# Patient Record
Sex: Male | Born: 1960 | Race: White | Hispanic: No | Smoking: Never smoker
Health system: Southern US, Community
[De-identification: ages and names within clinical notes are randomized; demographics above are authoritative.]

## PROBLEM LIST (undated history)

## (undated) DIAGNOSIS — R531 Weakness: Secondary | ICD-10-CM

## (undated) DIAGNOSIS — Z8489 Family history of other specified conditions: Secondary | ICD-10-CM

## (undated) DIAGNOSIS — M519 Unspecified thoracic, thoracolumbar and lumbosacral intervertebral disc disorder: Secondary | ICD-10-CM

## (undated) HISTORY — PX: OTHER SURGICAL HISTORY: SHX169

---

## 2009-09-25 ENCOUNTER — Encounter: Admission: RE | Admit: 2009-09-25 | Discharge: 2009-09-25 | Payer: Self-pay | Admitting: Occupational Medicine

## 2013-04-18 ENCOUNTER — Other Ambulatory Visit: Payer: Self-pay | Admitting: Orthopaedic Surgery

## 2013-04-18 DIAGNOSIS — M542 Cervicalgia: Secondary | ICD-10-CM

## 2013-04-22 ENCOUNTER — Ambulatory Visit
Admission: RE | Admit: 2013-04-22 | Discharge: 2013-04-22 | Disposition: A | Discharge status: Home / Self Care | Payer: 59 | Source: Ambulatory Visit | Attending: Orthopaedic Surgery | Admitting: Orthopaedic Surgery

## 2013-04-22 DIAGNOSIS — M542 Cervicalgia: Secondary | ICD-10-CM

## 2013-04-29 ENCOUNTER — Other Ambulatory Visit (HOSPITAL_COMMUNITY): Payer: Self-pay | Admitting: Orthopaedic Surgery

## 2013-05-01 ENCOUNTER — Other Ambulatory Visit (HOSPITAL_COMMUNITY): Payer: Self-pay | Admitting: *Deleted

## 2013-05-01 NOTE — Pre-Procedure Instructions (Addendum)
Dale Melton  05/01/2013   Your procedure is scheduled on:  05/10/13 @ 7:30 AM  Report to [facility] short stay admitting at 530 AM.  Call this number if you have problems the morning of surgery: [phone]   Remember:   Do not eat food or drink liquids after midnight.   Take these medicines the morning of surgery with A SIP OF WATER:    Do not wear jewelry, make-up or nail polish.  Do not wear lotions, powders, or perfumes. You may wear deodorant.  Do not shave 48 hours prior to surgery. Men may shave face and neck.  Do not bring valuables to the hospital.  [facility] is not responsible                  for any belongings or valuables.               Contacts, dentures or bridgework may not be worn into surgery.  Leave suitcase in the car. After surgery it may be brought to your room.  For patients admitted to the hospital, discharge time is determined by your                treatment team.               Patients discharged the day of surgery will not be allowed to drive  home.  Name and phone number of your driver:   Special Instructions: Shower using CHG 2 nights before surgery and the night before surgery.  If you shower the day of surgery use CHG.  Use special wash - you have one bottle of CHG for all showers.  You should use approximately 1/3 of the bottle for each shower.   Please read over the following fact sheets that you were given: Pain Booklet, Coughing and Deep Breathing, Blood Transfusion Information, MRSA Information and Surgical Site Infection Prevention

## 2013-05-03 ENCOUNTER — Encounter (HOSPITAL_COMMUNITY): Payer: Self-pay

## 2013-05-03 ENCOUNTER — Encounter (HOSPITAL_COMMUNITY)
Admission: RE | Admit: 2013-05-03 | Discharge: 2013-05-03 | Disposition: A | Discharge status: Home / Self Care | Payer: 59 | Source: Ambulatory Visit | Attending: Orthopaedic Surgery | Admitting: Orthopaedic Surgery

## 2013-05-03 ENCOUNTER — Encounter (HOSPITAL_COMMUNITY): Payer: Self-pay | Admitting: Pharmacy Technician

## 2013-05-03 DIAGNOSIS — Z01818 Encounter for other preprocedural examination: Secondary | ICD-10-CM | POA: Insufficient documentation

## 2013-05-03 DIAGNOSIS — Z01810 Encounter for preprocedural cardiovascular examination: Secondary | ICD-10-CM | POA: Insufficient documentation

## 2013-05-03 DIAGNOSIS — Z01812 Encounter for preprocedural laboratory examination: Secondary | ICD-10-CM | POA: Insufficient documentation

## 2013-05-03 DIAGNOSIS — Z01811 Encounter for preprocedural respiratory examination: Secondary | ICD-10-CM | POA: Insufficient documentation

## 2013-05-03 HISTORY — DX: Family history of other specified conditions: Z84.89

## 2013-05-03 HISTORY — DX: Weakness: R53.1

## 2013-05-03 LAB — URINALYSIS, ROUTINE W REFLEX MICROSCOPIC
Hgb urine dipstick: NEGATIVE
Ketones, ur: NEGATIVE mg/dL
Leukocytes, UA: NEGATIVE
Nitrite: NEGATIVE
Protein, ur: NEGATIVE mg/dL
Specific Gravity, Urine: >1.03 — ABNORMAL HIGH (ref 1.005–1.030)
Urobilinogen, UA: 0.2 mg/dL (ref 0.0–1.0)
pH: 5.5 (ref 5.0–8.0)

## 2013-05-03 LAB — CBC
HCT: 43.8 % (ref 39.0–52.0)
Hemoglobin: 15.3 g/dL (ref 13.0–17.0)
MCHC: 34.9 g/dL (ref 30.0–36.0)
MCV: 91.6 fL (ref 78.0–100.0)
Platelets: 267 10*3/uL (ref 150–400)
RDW: 12.4 % (ref 11.5–15.5)
WBC: 4.9 10*3/uL (ref 4.0–10.5)

## 2013-05-03 LAB — COMPREHENSIVE METABOLIC PANEL
AST: 17 U/L (ref 0–37)
Albumin: 4 g/dL (ref 3.5–5.2)
Alkaline Phosphatase: 41 U/L (ref 39–117)
Calcium: 8.9 mg/dL (ref 8.4–10.5)
Chloride: 100 mEq/L (ref 96–112)
Creatinine, Ser: 1.07 mg/dL (ref 0.50–1.35)
GFR calc Af Amer: >90 mL/min (ref >90)
Total Protein: 7.1 g/dL (ref 6.0–8.3)

## 2013-05-03 LAB — TYPE AND SCREEN: Antibody Screen: NEGATIVE

## 2013-05-03 LAB — PROTIME-INR
INR: 1.14 (ref 0.00–1.49)
Prothrombin Time: 14.4 seconds (ref 11.6–15.2)

## 2013-05-03 LAB — SURGICAL PCR SCREEN: MRSA, PCR: NEGATIVE

## 2013-05-03 MED ORDER — CHLORHEXIDINE GLUCONATE 4 % EX LIQD: 60.0000 mL | Freq: Once | CUTANEOUS | Status: DC

## 2013-05-03 NOTE — Progress Notes (Addendum)
Pt doesn't have a cardiologist  Stress test done about 3+yrs ago as part of exit physical for retiring from [employer]  Denies ever having an echo or heart cath  Denies EKG or CXR in past year  Pt doesn't have a Medical Md

## 2013-05-08 NOTE — H&P (Signed)
Dale Melton is an 52 y.o. male.   Chief Complaint: neck pain and left arm pain HPI:   Dale Melton complains of left shoulder blade pain for the last 2-3 months.  He says that the pain radiates from the shoulder blade into the shoulder, sometimes down the ulnar side of his left arm.  He endorses numbness and tingling and some weakness.  He says that the pain directly tender right at the medial border of the scapula near an area where he had a skin cancer removed.  He says that the pain is a shooting, stabbing pain that is constant in the arm.  He is right hand dominant.  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  He does have night pain.  It keeps him awake at night.  He is not able to lay on his left side.  In terms of his elbow pain he says that this is a constant dull pain that has recently gotten worse.  He has weakness of his grip and some numbness on the ulnar side of his left hand. No relief with analgesics or NSAIDS or activity modification.   MRI of cervical spine with right C7-T1 HNP with foraminal stenosis.  EMG and NCV with severe acute C8 radiculopathy left. Recommended that pt have ACDF C7-T1. Past Medical History  Diagnosis Date  . Family history of anesthesia complication     pts dad gets sick after anesthesia  . Weakness     numbness/tingling in left arm    Past Surgical History  Procedure Laterality Date  . Cataract surgery Bilateral     No family history on file. Social History:  reports that he has never smoked. He does not have any smokeless tobacco history on file. He reports that he drinks alcohol. He reports that he does not use illicit drugs.  Allergies: No Known Allergies  No prescriptions prior to admission    No results found for this or any previous visit (from the past 48 hour(s)). No results found.  Review of Systems  Musculoskeletal: Positive for back pain.  Neurological: Positive for sensory change and focal weakness.       Left  UE  All other systems reviewed and are negative.    There were no vitals taken for this visit. Physical Exam  Constitutional: He is oriented to person, place, and time. He appears well-developed and well-nourished.  HENT:  Head: Normocephalic and atraumatic.  Eyes: Pupils are equal, round, and reactive to light.  Cardiovascular: Normal rate.   Respiratory: Effort normal.  GI: Soft.  Musculoskeletal:    Exam of the left scapula shows a post-surgical scar, but this is not tender.  The area that is tender is lateral to it along the media border of the scapula.  Scapular movement is normal.  Left upper extremity appearance is normal.  Exam of the left elbow shows pain in the cubital tunnel.  He has a negative Tinel sign, negative compression sign.  He does have decreased sensation in the ulnar nerve distribution of the left hand.  He has slighter weaker grip of the left hand and he is slightly weaker at the first dorsal interosseous muscle.  Hand is warm and well perfused.    Neurological: He is alert and oriented to person, place, and time.  Skin: Skin is warm and dry.  Psychiatric: He has a normal mood and affect.     Assessment/Plan HNP C7-T1  PLAN:  ACDF C7-T1  Dale Melton  M 05/08/2013, 4:09 PM

## 2013-05-09 MED ORDER — CEFAZOLIN SODIUM-DEXTROSE 2-3 GM-% IV SOLR
2.0000 g | INTRAVENOUS | Status: AC
  Administered 2013-05-10: 2 g via INTRAVENOUS
  Filled 2013-05-09: qty 50

## 2013-05-10 ENCOUNTER — Encounter (HOSPITAL_COMMUNITY): Payer: 59 | Admitting: Anesthesiology

## 2013-05-10 ENCOUNTER — Ambulatory Visit (HOSPITAL_COMMUNITY): Payer: 59

## 2013-05-10 ENCOUNTER — Encounter (HOSPITAL_COMMUNITY): Payer: Self-pay | Admitting: Surgery

## 2013-05-10 ENCOUNTER — Encounter (HOSPITAL_COMMUNITY)
Admission: RE | Disposition: A | Discharge status: Home / Self Care | Payer: Self-pay | Source: Ambulatory Visit | Attending: Orthopaedic Surgery

## 2013-05-10 ENCOUNTER — Inpatient Hospital Stay (HOSPITAL_COMMUNITY)
Admission: RE | Admit: 2013-05-10 | Discharge: 2013-05-11 | DRG: 473 | Disposition: A | Discharge status: Home / Self Care | Payer: 59 | Source: Ambulatory Visit | Attending: Orthopaedic Surgery | Admitting: Orthopaedic Surgery

## 2013-05-10 ENCOUNTER — Ambulatory Visit (HOSPITAL_COMMUNITY): Payer: 59 | Admitting: Anesthesiology

## 2013-05-10 DIAGNOSIS — R071 Chest pain on breathing: Secondary | ICD-10-CM | POA: Diagnosis not present

## 2013-05-10 DIAGNOSIS — M5020 Other cervical disc displacement, unspecified cervical region: Principal | ICD-10-CM | POA: Diagnosis present

## 2013-05-10 HISTORY — PX: ANTERIOR CERVICAL DECOMP/DISCECTOMY FUSION: SHX1161

## 2013-05-10 HISTORY — DX: Unspecified thoracic, thoracolumbar and lumbosacral intervertebral disc disorder: M51.9

## 2013-05-10 SURGERY — ANTERIOR CERVICAL DECOMPRESSION/DISCECTOMY FUSION 2 LEVELS
Anesthesia: General | Site: Neck

## 2013-05-10 MED ORDER — NEOSTIGMINE METHYLSULFATE 1 MG/ML IJ SOLN
INTRAMUSCULAR | Status: DC | PRN
  Administered 2013-05-10: 3 mg via INTRAVENOUS

## 2013-05-10 MED ORDER — METHOCARBAMOL 500 MG PO TABS: 500.0000 mg | ORAL_TABLET | Freq: Four times a day (QID) | ORAL | Status: DC | PRN

## 2013-05-10 MED ORDER — LACTATED RINGERS IV SOLN
INTRAVENOUS | Status: DC | PRN
  Administered 2013-05-10 (×2): via INTRAVENOUS

## 2013-05-10 MED ORDER — METHOCARBAMOL 100 MG/ML IJ SOLN
500.0000 mg | Freq: Four times a day (QID) | INTRAMUSCULAR | Status: DC | PRN
  Filled 2013-05-10 (×2): qty 5

## 2013-05-10 MED ORDER — MORPHINE SULFATE 2 MG/ML IJ SOLN
1.0000 mg | INTRAMUSCULAR | Status: DC | PRN
  Administered 2013-05-10: 2 mg via INTRAVENOUS

## 2013-05-10 MED ORDER — SODIUM CHLORIDE 0.9 % IJ SOLN: 3.0000 mL | INTRAMUSCULAR | Status: DC | PRN

## 2013-05-10 MED ORDER — SENNOSIDES-DOCUSATE SODIUM 8.6-50 MG PO TABS: 1.0000 | ORAL_TABLET | Freq: Every evening | ORAL | Status: DC | PRN

## 2013-05-10 MED ORDER — PROMETHAZINE HCL 25 MG/ML IJ SOLN
12.5000 mg | Freq: Four times a day (QID) | INTRAMUSCULAR | Status: DC | PRN
  Administered 2013-05-10: 12.5 mg via INTRAVENOUS
  Filled 2013-05-10 (×2): qty 1

## 2013-05-10 MED ORDER — MORPHINE SULFATE 2 MG/ML IJ SOLN
INTRAMUSCULAR | Status: AC
Start: 2013-05-10 — End: 2013-05-10
  Filled 2013-05-10: qty 1

## 2013-05-10 MED ORDER — KCL IN DEXTROSE-NACL 20-5-0.45 MEQ/L-%-% IV SOLN
INTRAVENOUS | Status: DC
  Administered 2013-05-10 – 2013-05-11 (×2): via INTRAVENOUS
  Filled 2013-05-10 (×3): qty 1000

## 2013-05-10 MED ORDER — ZOLPIDEM TARTRATE 5 MG PO TABS: 5.0000 mg | ORAL_TABLET | Freq: Every evening | ORAL | Status: DC | PRN

## 2013-05-10 MED ORDER — DOCUSATE SODIUM 100 MG PO CAPS
100.0000 mg | ORAL_CAPSULE | Freq: Two times a day (BID) | ORAL | Status: DC
Start: 2013-05-10 — End: 2013-05-11
  Filled 2013-05-10 (×3): qty 1

## 2013-05-10 MED ORDER — MENTHOL 3 MG MT LOZG: 1.0000 | LOZENGE | OROMUCOSAL | Status: DC | PRN

## 2013-05-10 MED ORDER — EPHEDRINE SULFATE 50 MG/ML IJ SOLN
INTRAMUSCULAR | Status: DC | PRN
  Administered 2013-05-10: 15 mg via INTRAVENOUS

## 2013-05-10 MED ORDER — INFLUENZA VAC SPLIT QUAD 0.5 ML IM SUSP
0.5000 mL | INTRAMUSCULAR | Status: DC
  Filled 2013-05-10: qty 0.5

## 2013-05-10 MED ORDER — HYDROMORPHONE HCL PF 1 MG/ML IJ SOLN: 0.2500 mg | INTRAMUSCULAR | Status: DC | PRN

## 2013-05-10 MED ORDER — KETOROLAC TROMETHAMINE 30 MG/ML IJ SOLN
INTRAMUSCULAR | Status: AC
Start: 2013-05-10 — End: 2013-05-10
  Filled 2013-05-10: qty 1

## 2013-05-10 MED ORDER — PHENOL 1.4 % MT LIQD
1.0000 | OROMUCOSAL | Status: DC | PRN
  Filled 2013-05-10: qty 177

## 2013-05-10 MED ORDER — FENTANYL CITRATE 0.05 MG/ML IJ SOLN
INTRAMUSCULAR | Status: DC | PRN
  Administered 2013-05-10: 150 ug via INTRAVENOUS

## 2013-05-10 MED ORDER — METHOCARBAMOL 500 MG PO TABS: 500.0000 mg | ORAL_TABLET | Freq: Four times a day (QID) | ORAL | Status: AC | PRN

## 2013-05-10 MED ORDER — KETOROLAC TROMETHAMINE 30 MG/ML IJ SOLN
30.0000 mg | Freq: Once | INTRAMUSCULAR | Status: AC
  Administered 2013-05-10: 30 mg via INTRAVENOUS

## 2013-05-10 MED ORDER — ONDANSETRON HCL 4 MG/2ML IJ SOLN
4.0000 mg | INTRAMUSCULAR | Status: DC | PRN
  Administered 2013-05-10: 4 mg via INTRAVENOUS
  Filled 2013-05-10: qty 2

## 2013-05-10 MED ORDER — PHENYLEPHRINE HCL 10 MG/ML IJ SOLN
10.0000 mg | INTRAVENOUS | Status: DC | PRN
  Administered 2013-05-10: 40 ug/min via INTRAVENOUS

## 2013-05-10 MED ORDER — LIDOCAINE HCL 4 % MT SOLN
OROMUCOSAL | Status: DC | PRN
  Administered 2013-05-10: 4 mL via TOPICAL

## 2013-05-10 MED ORDER — ACETAMINOPHEN 650 MG RE SUPP: 650.0000 mg | RECTAL | Status: DC | PRN

## 2013-05-10 MED ORDER — SODIUM CHLORIDE 0.9 % IJ SOLN
3.0000 mL | Freq: Two times a day (BID) | INTRAMUSCULAR | Status: DC
Start: 2013-05-10 — End: 2013-05-11
  Administered 2013-05-10: 3 mL via INTRAVENOUS

## 2013-05-10 MED ORDER — BISACODYL 10 MG RE SUPP: 10.0000 mg | Freq: Every day | RECTAL | Status: DC | PRN

## 2013-05-10 MED ORDER — BUPIVACAINE HCL 0.25 % IJ SOLN
INTRAMUSCULAR | Status: DC | PRN
  Administered 2013-05-10: 7 mL

## 2013-05-10 MED ORDER — ACETAMINOPHEN 325 MG PO TABS: 650.0000 mg | ORAL_TABLET | ORAL | Status: DC | PRN

## 2013-05-10 MED ORDER — ONDANSETRON HCL 4 MG/2ML IJ SOLN
INTRAMUSCULAR | Status: DC | PRN
  Administered 2013-05-10: 4 mg via INTRAVENOUS

## 2013-05-10 MED ORDER — PANTOPRAZOLE SODIUM 40 MG IV SOLR
40.0000 mg | Freq: Every day | INTRAVENOUS | Status: DC
  Administered 2013-05-10: 40 mg via INTRAVENOUS
  Filled 2013-05-10 (×2): qty 40

## 2013-05-10 MED ORDER — SODIUM CHLORIDE 0.9 % IV SOLN: 250.0000 mL | INTRAVENOUS | Status: DC

## 2013-05-10 MED ORDER — CEFAZOLIN SODIUM 1-5 GM-% IV SOLN
1.0000 g | Freq: Once | INTRAVENOUS | Status: AC
  Administered 2013-05-10: 1 g via INTRAVENOUS
  Filled 2013-05-10: qty 50

## 2013-05-10 MED ORDER — MIDAZOLAM HCL 5 MG/5ML IJ SOLN
INTRAMUSCULAR | Status: DC | PRN
  Administered 2013-05-10: 2 mg via INTRAVENOUS

## 2013-05-10 MED ORDER — OXYCODONE-ACETAMINOPHEN 5-325 MG PO TABS: 1.0000 | ORAL_TABLET | ORAL | Status: DC | PRN

## 2013-05-10 MED ORDER — ROCURONIUM BROMIDE 100 MG/10ML IV SOLN
INTRAVENOUS | Status: DC | PRN
  Administered 2013-05-10: 50 mg via INTRAVENOUS

## 2013-05-10 MED ORDER — HYDROCODONE-ACETAMINOPHEN 5-325 MG PO TABS: 1.0000 | ORAL_TABLET | ORAL | Status: DC | PRN

## 2013-05-10 MED ORDER — LIDOCAINE HCL (CARDIAC) 20 MG/ML IV SOLN
INTRAVENOUS | Status: DC | PRN
  Administered 2013-05-10: 80 mg via INTRAVENOUS

## 2013-05-10 MED ORDER — PROPOFOL 10 MG/ML IV BOLUS
INTRAVENOUS | Status: DC | PRN
  Administered 2013-05-10: 200 mg via INTRAVENOUS

## 2013-05-10 MED ORDER — FLEET ENEMA 7-19 GM/118ML RE ENEM: 1.0000 | ENEMA | Freq: Once | RECTAL | Status: AC | PRN

## 2013-05-10 MED ORDER — GLYCOPYRROLATE 0.2 MG/ML IJ SOLN
INTRAMUSCULAR | Status: DC | PRN
  Administered 2013-05-10: 0.6 mg via INTRAVENOUS

## 2013-05-10 MED ORDER — BUPIVACAINE HCL (PF) 0.25 % IJ SOLN
INTRAMUSCULAR | Status: AC
  Filled 2013-05-10: qty 30

## 2013-05-10 MED ORDER — OXYCODONE-ACETAMINOPHEN 5-325 MG PO TABS: 1.0000 | ORAL_TABLET | ORAL | Status: AC | PRN

## 2013-05-10 MED ORDER — ONDANSETRON HCL 4 MG/2ML IJ SOLN: 4.0000 mg | Freq: Once | INTRAMUSCULAR | Status: DC | PRN

## 2013-05-10 SURGICAL SUPPLY — 58 items
ADH SKN CLS APL DERMABOND .7 (GAUZE/BANDAGES/DRESSINGS) ×1
APL SKNCLS STERI-STRIP NONHPOA (GAUZE/BANDAGES/DRESSINGS) ×1
BENZOIN TINCTURE PRP APPL 2/3 (GAUZE/BANDAGES/DRESSINGS) ×1 IMPLANT
BIT DRILL SKYLINE 14 (BIT) ×1
BIT DRILL SKYLINE 14MM (BIT) IMPLANT
BLADE SURG ROTATE 9660 (MISCELLANEOUS) ×1 IMPLANT
BONE CERV LORDOTIC 14.5X12X6 (Bone Implant) ×2 IMPLANT
BUR ROUND FLUTED 4 SOFT TCH (BURR) IMPLANT
CLOTH BEACON ORANGE TIMEOUT ST (SAFETY) ×2 IMPLANT
CLSR STERI-STRIP ANTIMIC 1/2X4 (GAUZE/BANDAGES/DRESSINGS) ×1 IMPLANT
COLLAR CERV LO CONTOUR FIRM DE (SOFTGOODS) ×2 IMPLANT
CORDS BIPOLAR (ELECTRODE) ×1 IMPLANT
COVER SURGICAL LIGHT HANDLE (MISCELLANEOUS) ×2 IMPLANT
DERMABOND ADVANCED (GAUZE/BANDAGES/DRESSINGS) ×1
DERMABOND ADVANCED .7 DNX12 (GAUZE/BANDAGES/DRESSINGS) ×1 IMPLANT
DRAPE C-ARM 42X72 X-RAY (DRAPES) ×2 IMPLANT
DRAPE MICROSCOPE LEICA (MISCELLANEOUS) ×2 IMPLANT
DRAPE PROXIMA HALF (DRAPES) ×2 IMPLANT
DRILL BIT SKYLINE 14MM (BIT) ×2
DRSG MEPILEX BORDER 4X4 (GAUZE/BANDAGES/DRESSINGS) ×2 IMPLANT
DRSG MEPILEX BORDER 4X8 (GAUZE/BANDAGES/DRESSINGS) ×2 IMPLANT
DURAPREP 6ML APPLICATOR 50/CS (WOUND CARE) ×2 IMPLANT
ELECT COATED BLADE 2.86 ST (ELECTRODE) ×2 IMPLANT
ELECT REM PT RETURN 9FT ADLT (ELECTROSURGICAL) ×2
ELECTRODE REM PT RTRN 9FT ADLT (ELECTROSURGICAL) ×1 IMPLANT
EVACUATOR 1/8 PVC DRAIN (DRAIN) ×2 IMPLANT
GAUZE XEROFORM 1X8 LF (GAUZE/BANDAGES/DRESSINGS) ×2 IMPLANT
GLOVE BIOGEL PI IND STRL 7.5 (GLOVE) ×1 IMPLANT
GLOVE BIOGEL PI IND STRL 8 (GLOVE) ×1 IMPLANT
GLOVE BIOGEL PI INDICATOR 7.5 (GLOVE) ×1
GLOVE BIOGEL PI INDICATOR 8 (GLOVE) ×1
GLOVE ECLIPSE 7.0 STRL STRAW (GLOVE) ×2 IMPLANT
GLOVE ORTHO TXT STRL SZ7.5 (GLOVE) ×2 IMPLANT
GOWN PREVENTION PLUS LG XLONG (DISPOSABLE) ×1 IMPLANT
GOWN PREVENTION PLUS XLARGE (GOWN DISPOSABLE) ×2 IMPLANT
GOWN STRL NON-REIN LRG LVL3 (GOWN DISPOSABLE) ×4 IMPLANT
GRAFT BNE SPCR VG2 14.5X12X6 (Bone Implant) IMPLANT
HEAD HALTER (SOFTGOODS) ×2 IMPLANT
HEMOSTAT SURGICEL 2X14 (HEMOSTASIS) IMPLANT
KIT BASIN OR (CUSTOM PROCEDURE TRAY) ×2 IMPLANT
KIT ROOM TURNOVER OR (KITS) ×2 IMPLANT
MANIFOLD NEPTUNE II (INSTRUMENTS) ×2 IMPLANT
NDL 25GX 5/8IN NON SAFETY (NEEDLE) ×1 IMPLANT
NEEDLE 25GX 5/8IN NON SAFETY (NEEDLE) ×2 IMPLANT
NS IRRIG 1000ML POUR BTL (IV SOLUTION) ×2 IMPLANT
PACK ORTHO CERVICAL (CUSTOM PROCEDURE TRAY) ×2 IMPLANT
PAD ARMBOARD 7.5X6 YLW CONV (MISCELLANEOUS) ×4 IMPLANT
PATTIES SURGICAL .5 X.5 (GAUZE/BANDAGES/DRESSINGS) ×1 IMPLANT
PLATE ONE LEVEL SKYLINE 14MM (Plate) ×1 IMPLANT
SCREW VAR SELF TAP SKYLINE 14M (Screw) ×4 IMPLANT
SPONGE GAUZE 4X4 12PLY (GAUZE/BANDAGES/DRESSINGS) ×2 IMPLANT
SURGIFLO TRUKIT (HEMOSTASIS) IMPLANT
SUT VIC AB 3-0 X1 27 (SUTURE) ×2 IMPLANT
SUT VICRYL 4-0 PS2 18IN ABS (SUTURE) ×4 IMPLANT
SYR 30ML SLIP (SYRINGE) ×2 IMPLANT
TOWEL OR 17X24 6PK STRL BLUE (TOWEL DISPOSABLE) ×2 IMPLANT
TOWEL OR 17X26 10 PK STRL BLUE (TOWEL DISPOSABLE) ×2 IMPLANT
WATER STERILE IRR 1000ML POUR (IV SOLUTION) ×2 IMPLANT

## 2013-05-10 NOTE — Progress Notes (Signed)
Patient ID: Dale Melton, male   DOB: [birth year], 53 y.o.   MRN: [id number] Pt just delivered to room and on the way to the floor complained of chest tightness.  He had not had any discomfort in the PACU.  Denies radiation of pain in the arms or neck.  States he feels tight over his anterior chest and indicates this area above nipple line and bilateral.  No pain with deep inspiration. Some discomfort with movement.  Has not had any pain medication in the PACU.  Vital signs have been stable and remained so when taken in room currently.  Exam:  Heart sounds clear and without murmur.  Regular rate and rhythm to auscultation.  Tender over anterior chest wall with palpation.  Mildly tender just below clavicle right > left.  nontender with motion of bilateral UE and shoulder motion.  hemovac draining well.  Lungs sounds clear.  Taking clear liquids now. VS temp 97.4, pulse 77 reg, resp 18, BP 124/71, 100% room air  IMP:  S/P C7-T1 ACDF.  Current anterior chest wall pain likely related to manual traction during procedure as he is most uncomfortable with palpation over chest wall.  Will check EKG to assess possible cardiac source of pain.   Will give analgesics as well.

## 2013-05-10 NOTE — Discharge Instructions (Signed)
No lifting greater than 10 lbs. No overhead use of arms. Avoid bending,and twisting neck. Walk in house for first week them may start to get out slowly increasing distance up to one mile by 3 weeks post op. Keep incision dry for 3 days, may then bathe and wet incision using a covered collar when showering. Call if any fevers >101, chills, or increasing numbness or weakness or increased swelling or drainage. Ice packs as needed for pain and swelling.  Soft diet for sore throat, advance as tolerated.  Wear soft collar at ALL times, even when sleeping.

## 2013-05-10 NOTE — Anesthesia Procedure Notes (Signed)
Procedure Name: Intubation Date/Time: 05/10/2013 7:38 AM Performed by: Carney Living Pre-anesthesia Checklist: Patient identified, Emergency Drugs available, Suction available, Patient being monitored and Timeout performed Patient Re-evaluated:Patient Re-evaluated prior to inductionOxygen Delivery Method: Circle system utilized Preoxygenation: Pre-oxygenation with 100% oxygen Intubation Type: IV induction Ventilation: Mask ventilation without difficulty Laryngoscope Size: Mac and 4 Grade View: Grade I Tube type: Oral Tube size: 7.5 mm Number of attempts: 1 Airway Equipment and Method: Stylet and LTA kit utilized Placement Confirmation: ETT inserted through vocal cords under direct vision,  positive ETCO2 and breath sounds checked- equal and bilateral Secured at: 23 cm Tube secured with: Tape Dental Injury: Teeth and Oropharynx as per pre-operative assessment

## 2013-05-10 NOTE — Plan of Care (Signed)
Problem: Consults Goal: Diagnosis - Spinal Surgery Cervical Spine Fusion: ACDF C7-T1     

## 2013-05-10 NOTE — Brief Op Note (Cosign Needed)
05/10/2013  9:15 AM  PATIENT:  Dale Melton  53 y.o. male  PRE-OPERATIVE DIAGNOSIS:  Left C7-T1 HNP  POST-OPERATIVE DIAGNOSIS:  Left C7-T1 HNP  PROCEDURE:  Procedure(s) with comments: ANTERIOR CERVICAL DECOMPRESSION/DISCECTOMY FUSION (N/A) - C7-T1 Anterior Cervical Discectomy and Fusion, Allograft, Plate  SURGEON:  Surgeon(s) and Role:    * Eldred MangesMark C Yates, MD - Primary  PHYSICIAN ASSISTANT: Maud DeedSheila Tri Chittick PAC  ASSISTANTS: none   ANESTHESIA:   general  EBL:     BLOOD ADMINISTERED:none  DRAINS: (1) Hemovact drain(s) in the anterior neck with  Suction Open   LOCAL MEDICATIONS USED:  MARCAINE     SPECIMEN:  No Specimen  DISPOSITION OF SPECIMEN:  N/A  COUNTS:  YES  TOURNIQUET:  * No tourniquets in log *  DICTATION: .Note written in EPIC  PLAN OF CARE: Admit to inpatient   PATIENT DISPOSITION:  PACU - hemodynamically stable.   Delay start of Pharmacological VTE agent (>24hrs) due to surgical blood loss or risk of bleeding: yes

## 2013-05-10 NOTE — Interval H&P Note (Signed)
History and Physical Interval Note:  05/10/2013 7:23 AM  Dale Melton  has presented today for surgery, with the diagnosis of Left C7-T1 HNP  The various methods of treatment have been discussed with the patient and family. After consideration of risks, benefits and other options for treatment, the patient has consented to  Procedure(s) with comments: ANTERIOR CERVICAL DECOMPRESSION/DISCECTOMY FUSION 2 LEVELS (N/A) - C7-T1 Anterior Cervical Discectomy and Fusion, Allograft, Plate as a surgical intervention .  The patient's history has been reviewed, patient examined, no change in status, stable for surgery.  I have reviewed the patient's chart and labs.  Questions were answered to the patient's satisfaction.     Valeda Corzine C

## 2013-05-10 NOTE — Anesthesia Preprocedure Evaluation (Addendum)
Anesthesia Evaluation  Patient identified by MRN, date of birth, ID band Patient awake    Reviewed: Allergy & Precautions, H&P , NPO status , Patient's Chart, lab work & pertinent test results  Airway Mallampati: II TM Distance: >3 FB Neck ROM: Full    Dental  (+) Teeth Intact and Dental Advisory Given   Pulmonary          Cardiovascular     Neuro/Psych    GI/Hepatic   Endo/Other    Renal/GU      Musculoskeletal   Abdominal   Peds  Hematology   Anesthesia Other Findings   Reproductive/Obstetrics                          Anesthesia Physical Anesthesia Plan  ASA: I  Anesthesia Plan: General   Post-op Pain Management:    Induction: Intravenous  Airway Management Planned: Oral ETT  Additional Equipment:   Intra-op Plan:   Post-operative Plan: Extubation in OR  Informed Consent: I have reviewed the patients History and Physical, chart, labs and discussed the procedure including the risks, benefits and alternatives for the proposed anesthesia with the patient or authorized representative who has indicated his/her understanding and acceptance.   Dental advisory given  Plan Discussed with: CRNA and Anesthesiologist  Anesthesia Plan Comments:        Anesthesia Quick Evaluation

## 2013-05-10 NOTE — Preoperative (Signed)
Beta Blockers   Reason not to administer Beta Blockers:Not Applicable 

## 2013-05-10 NOTE — Transfer of Care (Signed)
Immediate Anesthesia Transfer of Care Note  Patient: Dale Melton  Procedure(s) Performed: Procedure(s) with comments: ANTERIOR CERVICAL DECOMPRESSION/DISCECTOMY FUSION (N/A) - C7-T1 Anterior Cervical Discectomy and Fusion, Allograft, Plate  Patient Location: PACU  Anesthesia Type:General  Level of Consciousness: awake, alert , oriented and patient cooperative  Airway & Oxygen Therapy: Patient Spontanous Breathing and Patient connected to nasal cannula oxygen  Post-op Assessment: Report given to PACU RN and Patient moving all extremities X 4  Post vital signs: Reviewed and stable  Complications: No apparent anesthesia complications

## 2013-05-10 NOTE — Anesthesia Postprocedure Evaluation (Signed)
  Anesthesia Post-op Note  Patient: Dale Melton  Procedure(s) Performed: Procedure(s) with comments: ANTERIOR CERVICAL DECOMPRESSION/DISCECTOMY FUSION (N/A) - C7-T1 Anterior Cervical Discectomy and Fusion, Allograft, Plate  Patient Location: PACU  Anesthesia Type:General  Level of Consciousness: awake, alert , oriented and patient cooperative  Airway and Oxygen Therapy: Patient Spontanous Breathing  Post-op Pain: mild  Post-op Assessment: Post-op Vital signs reviewed, Patient's Cardiovascular Status Stable, Respiratory Function Stable, Patent Airway, No signs of Nausea or vomiting and Pain level controlled  Post-op Vital Signs: stable  Complications: No apparent anesthesia complications

## 2013-05-10 NOTE — Progress Notes (Signed)
Orthopedic Tech Progress Note Patient Details:  Dale Melton April 23, 1961 [id number]  Ortho Devices Type of Ortho Device: Soft collar Ortho Device/Splint Interventions: Application   Cammer, Mickie BailJennifer Carol 05/10/2013, 2:28 PM

## 2013-05-10 NOTE — Progress Notes (Signed)
Dr. Ophelia CharterYates notified of patient's c/o tightness -chest as soon as we got out of elevator towards patients room. Order for 12 lead EKG. Velna HatchetSheila PA at bedside. Examined pt. No other s/s given VS; T T 97.4, P 77, R 18 BP 124/71, 100% on room.

## 2013-05-11 NOTE — Progress Notes (Signed)
Patient discharged home accompanied by wife. Dressing changed as ordered. Discharge instructions reviewed with patient and family. Understanding stated. Will follow up as scheduled.

## 2013-05-11 NOTE — Progress Notes (Signed)
Patient ID: Dale AlexandersLarry J Colombo, male   DOB: 08-10-1960, 53 y.o.   MRN: [id number] Drain removed , good relief of pre-op arm pain , some shoulder and upper chest soreness. Pain mild. Discharge home. Collar stays on . Office one week.

## 2013-05-11 NOTE — Op Note (Signed)
Dale Melton, Dale Melton NO.:  [id number]  MEDICAL RECORD NO.:  [id number]  LOCATION:  [id number]                        FACILITY:  [facility]  PHYSICIAN:  Mark C. Ophelia Charter, M.D.    DATE OF BIRTH:  24-Oct-1960  DATE OF PROCEDURE:  05/10/2013 DATE OF DISCHARGE:                              OPERATIVE REPORT   PREOPERATIVE DIAGNOSIS:  C7-T1 herniated nucleus pulposus with radiculopathy.  POSTOPERATIVE DIAGNOSIS:  C7-T1 herniated nucleus pulposus with radiculopathy.  PROCEDURE:  C7-T1 anterior cervical diskectomy and fusion, allograft and plate.  DePuy plate, 14 mm screws, 6 mm cortical cancellous allograft.  ASSISTANT:  Maud Deed, PA-C, medically necessary and present for the entire procedure.  ESTIMATED BLOOD LOSS:  Minimal.  PROCEDURE IN DETAIL:  After induction of general anesthesia, orotracheal intubation, the patient had his arm out the side with yellow pads wrist restraints.  Cervical traction harness was applied, taped, but no weight was applied.  Neck was prepped with DuraPrep.  The area was squared with towels, sterile skin marker, Betadine, Steri-Drape application, thyroid sheets, and drapes.  Time-out procedure was completed.  Ancef was given prophylactically.  Midline incision was made starting at the midline extending to the left lobe just above the clavicle based on palpable landmarks and expected C7-T1.  The patient had a long neck and C7-T1 was accessible above the clavicle.  Blunt dissection was performed. Inferior thyroid transverse artery had to be coagulated.  Spur at C6-C7 was palpable and incision was directly over the C7-T1 level.  Spinal localization with short 25 needle straight clamp, placed in C7-T1, pulled down to wrist restraints across table, bilateral C-arm had been draped, confirming the appropriate level counting down from the skull. Spurs were at C6-C7 and the operative level was 1 level below.  Cloward teeth retractors were placed in  right and left, smooth blades up and down.  Diskectomy was performed using the operative microscope, regressing back to the posterior cortex.  There were extruded fragments present through the ligament, some were paracentral left and more cephalad back behind the C7 vertebral body.  These were teased out and removed until the dura was completely decompressed with particular attention to the left side.  Trial sizers, bone preparation end plates using the burr minimally and also using curettes and rasp.  A 1-2 mm Kerrisons were used to remove the posterior longitudinal ligament and spurs.  Trial sizers showed a 6 giving a nice restoration of the disk space height and was tight graft. C6-C7 level had anterior spurs present and with head halter traction applied, the graft was marked in the midline and graft was inserted at C7-T1 with the marked graft directly in midline countersunk 2 mm.  A Skyline plate was selected, 14 mm screws x4 were inserted, 14 mm plate, 14 mm screws.  Final spot x-rays confirmed excellent position and alignment.  Operative field was dry. Irrigation with saline solution.  Hemovac was placed in line with the skin incision in-and-out technique.  On the left side, reapproximation of platysma with 3-0 Vicryl, 4-0 Vicryl subcuticular closure.  Tincture of benzoin, Steri-Strips, Marcaine infiltration, postop dressing, and soft cervical collar.  Instrument count and needle count was correct.  The patient was transferred to recovery room.  He was neurologically intact and had good strength in the left upper extremity, complete relief of his preoperative radicular pain from the HNP and nerve root compression.     Mark C. Ophelia CharterYates, M.D.     MCY/MEDQ  D:  05/10/2013  T:  05/10/2013  Job:  [id number]

## 2013-05-14 NOTE — Discharge Summary (Signed)
Physician Discharge Summary  Patient ID: Dale Melton MRN: [id number] DOB/AGE: 53/24/1962 53 y.o.  Admit date: 05/10/2013 Discharge date: 05/11/2013  Admission Diagnoses:  HNP (herniated nucleus pulposus), cervical C7-T1  Discharge Diagnoses:  Principal Problem:   HNP (herniated nucleus pulposus), cervical C7-T1  Past Medical History  Diagnosis Date  . Family history of anesthesia complication     pts dad gets sick after anesthesia  . Weakness     numbness/tingling in left arm  . Ruptured disk     Surgeries: Procedure(s): ANTERIOR CERVICAL DECOMPRESSION/DISCECTOMY FUSION C7-T1 on 05/10/2013   Consultants (if any):  none  Discharged Condition: Improved  Hospital Course: Dale Melton is an 53 y.o. male who was admitted 05/10/2013 with a diagnosis of HNP (herniated nucleus pulposus), cervical and went to the operating room on 05/10/2013 and underwent the above named procedures.    He was given perioperative antibiotics:      Anti-infectives   Start     Dose/Rate Route Frequency Ordered Stop   05/10/13 1300  ceFAZolin (ANCEF) IVPB 1 g/50 mL premix     1 g 100 mL/hr over 30 Minutes Intravenous  Once 05/10/13 1114 05/10/13 1304   05/10/13 0600  ceFAZolin (ANCEF) IVPB 2 g/50 mL premix     2 g 100 mL/hr over 30 Minutes Intravenous On call to O.R. 05/09/13 1430 05/10/13 0738    Episode of anterior chest wall pain on first post op day when being transferred from PACU to his room.  EKG normal and vital signs normal.  Discomfort relieved with analgesics.  No cardiac symptoms.    He was given sequential compression devices, early ambulation for DVT prophylaxis.  He benefited maximally from the hospital stay and there were no complications.    Recent vital signs:  Filed Vitals:   05/11/13 0632  BP: 113/73  Pulse: 64  Temp: 97.6 F (36.4 C)  Resp: 16    Recent laboratory studies:  Lab Results  Component Value Date   HGB 15.3 05/03/2013   Lab Results  Component Value  Date   WBC 4.9 05/03/2013   PLT 267 05/03/2013   Lab Results  Component Value Date   INR 1.14 05/03/2013   Lab Results  Component Value Date   NA 137 05/03/2013   K 4.0 05/03/2013   CL 100 05/03/2013   CO2 29 05/03/2013   BUN 14 05/03/2013   CREATININE 1.07 05/03/2013   GLUCOSE 81 05/03/2013    Discharge Medications:     Medication List    STOP taking these medications       ibuprofen 200 MG tablet  Commonly known as:  ADVIL,MOTRIN      TAKE these medications       methocarbamol 500 MG tablet  Commonly known as:  ROBAXIN  Take 1 tablet (500 mg total) by mouth every 6 (six) hours as needed for muscle spasms (spasm).     oxyCODONE-acetaminophen 5-325 MG per tablet  Commonly known as:  ROXICET  Take 1-2 tablets by mouth every 4 (four) hours as needed for severe pain.     tadalafil 20 MG tablet  Commonly known as:  CIALIS  Take 10 mg by mouth every 4 (four) hours as needed for erectile dysfunction.     traMADol 50 MG tablet  Commonly known as:  ULTRAM  Take 50 mg by mouth every 6 (six) hours as needed for moderate pain.        Diagnostic Studies: Dg Chest 2 View  05/03/2013   CLINICAL DATA:  Preop cervical fusion.  EXAM: CHEST  2 VIEW  COMPARISON:  09/25/2009  FINDINGS: The cardiomediastinal silhouette is within normal limits. The lungs are well inflated and clear. There is no evidence of pleural effusion or pneumothorax. No acute osseous abnormality is identified.  IMPRESSION: Unremarkable chest radiographs.   Electronically Signed   By: Dale AcheAllen  Melton   On: 05/03/2013 10:24   Dg Cervical Spine 2-3 Views  05/10/2013   CLINICAL DATA:  Disc protrusion at C7-T1.  EXAM: CERVICAL SPINE - 2-3 VIEW; DG C-ARM 1-60 MIN  COMPARISON:  MRI dated 04/22/2013  FLUOROSCOPY TIME:  0 min 12 seconds  FINDINGS: Images demonstrate a localization needle at C7-T1. C-arm images also demonstrate an anterior plate and plug in place at C7-T1. The available images demonstrate only 1 screw in C7  and 2 screws in T1.  IMPRESSION: Anterior fusion at performed at C7-T1.   Electronically Signed   By: [name]  Maxwell M.D.   On: 05/10/2013 09:46   [name]  04/22/2013   CLINICAL DATA:  Neck pain. Left shoulder pain down the left arm with numbness and tingling in all finger tips for 2 months.  EXAM: MRI CERVICAL SPINE WITHOUT CONTRAST  TECHNIQUE: Multiplanar, multisequence MR imaging was performed. No intravenous contrast was administered.  COMPARISON:  None.  FINDINGS: There is straightening of the normal cervical lordosis. There is no listhesis. Vertebral body heights are preserved. Vertebral marrow signal is within normal limits. Multilevel disc desiccation is present throughout the cervical spine. Mild disc space narrowing and anterior endplate spurring are present at C6-7. The craniocervical junction is unremarkable. Cervical spinal cord is normal in caliber and signal. Paraspinal soft tissues are unremarkable.  C2-3:  Negative.  C3-4: Minimal disc bulge without spinal canal or neural foraminal stenosis.  C4-5:  Negative.  C5-6: Mild right facet arthrosis. No spinal canal or neural foraminal stenosis.  C6-7: Shallow disc bulge and right uncovertebral hypertrophy result in mild right neural foraminal narrowing and borderline spinal canal stenosis.  C7-T1: Moderate-sized left foraminal disc protrusion results in moderate left neural foraminal stenosis without spinal canal stenosis.  T1-2: Negative.  IMPRESSION: 1. Left-sided disc protrusion at C7-T1 with resultant moderate left neural foraminal stenosis. 2. Borderline spinal canal and mild right neural foraminal stenosis at C6-7.   Electronically Signed   By: Dale AcheAllen  Melton   On: 04/22/2013 17:47   Dg C-arm 1-60 Min  05/10/2013   CLINICAL DATA:  Disc protrusion at C7-T1.  EXAM: CERVICAL SPINE - 2-3 VIEW; DG C-ARM 1-60 MIN  COMPARISON:  MRI dated 04/22/2013  FLUOROSCOPY TIME:  0 min 12 seconds  FINDINGS: Images demonstrate a localization  needle at C7-T1. C-arm images also demonstrate an anterior plate and plug in place at C7-T1. The available images demonstrate only 1 screw in C7 and 2 screws in T1.  IMPRESSION: Anterior fusion at performed at C7-T1.   Electronically Signed   By: [name]  Maxwell M.D.   On: 05/10/2013 09:46    Disposition: 01-Home or Self Care Discharge instructions:No lifting greater than 10 lbs. No overhead use of arms. Avoid bending,and twisting neck. Walk in house for first week them may start to get out slowly increasing distance up to one mile by 3 weeks post op. Keep incision dry for 3 days, may then bathe and wet incision using a covered collar when showering. Call if any fevers >101, chills, or increasing numbness or weakness or increased swelling or drainage.  Follow-up Information   Follow up with Eldred Manges, MD. Schedule an appointment as soon as possible for a visit in 2 weeks.   Specialty:  Orthopedic Surgery   Contact information:   [address] [place] [address] [phone]        Signed: Wende Neighbors 05/14/2013, 4:06 PM

## 2013-05-15 ENCOUNTER — Encounter (HOSPITAL_COMMUNITY): Payer: Self-pay | Admitting: Orthopaedic Surgery

## 2013-06-11 ENCOUNTER — Encounter: Payer: Self-pay | Admitting: Family Medicine

## 2014-11-09 IMAGING — RF DG CERVICAL SPINE 2 OR 3 VIEWS
1 series · 8 of 8 positions shown · non-contrast
Comparison: MRI dated 04/22/2013

FLUOROSCOPY TIME:  0 min 12 seconds

CLINICAL DATA: Disc protrusion at C7-T1.

EXAM:
CERVICAL SPINE - 2-3 VIEW; DG C-ARM 1-60 MIN

[Series 1: run · 8 of 8 slices shown]
[im 1/8]
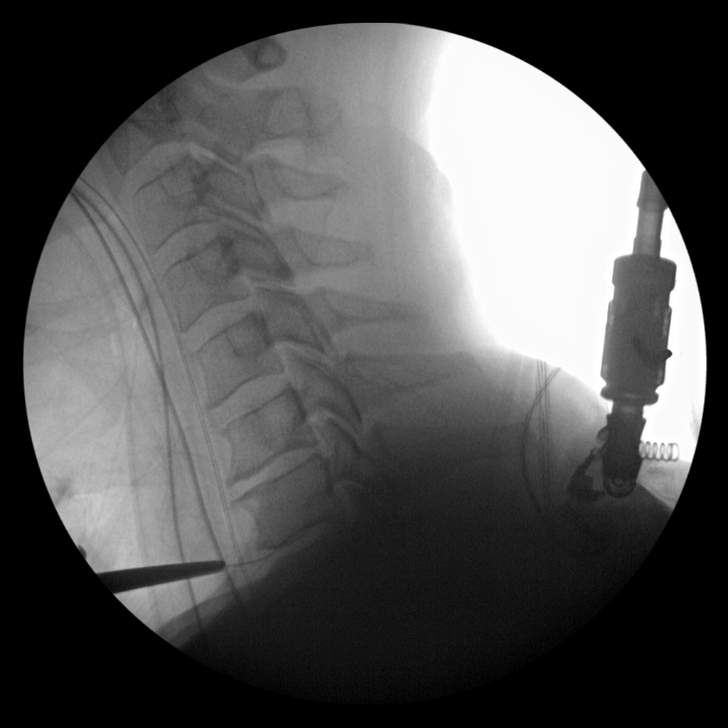
[im 2/8]
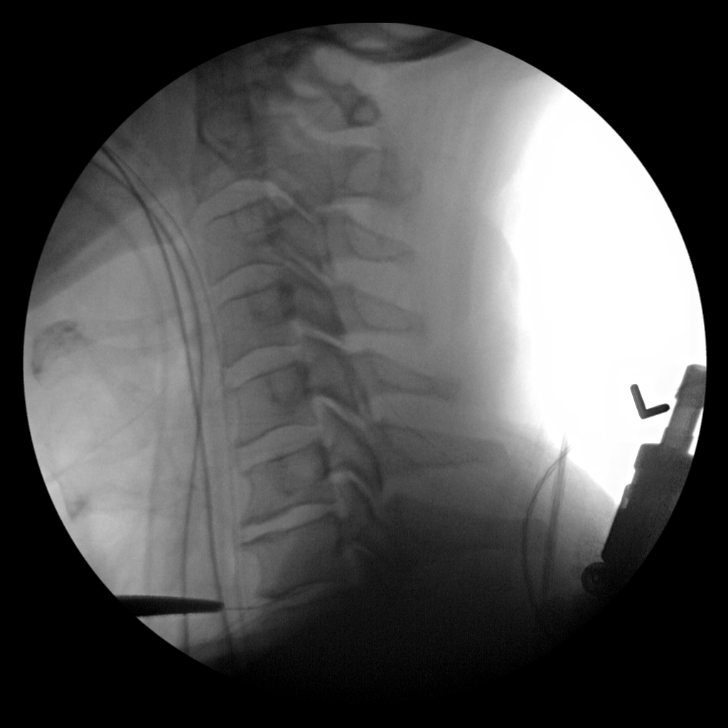
[im 3/8]
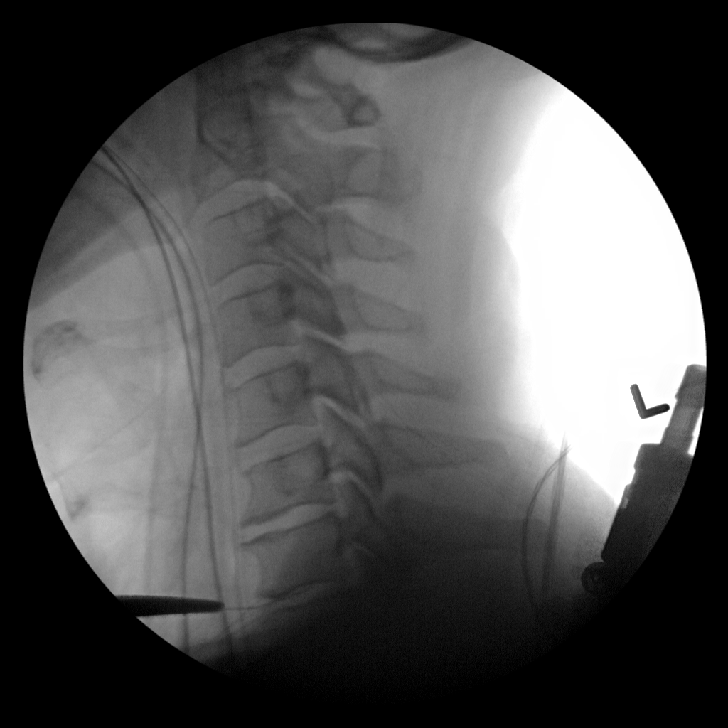
[im 4/8]
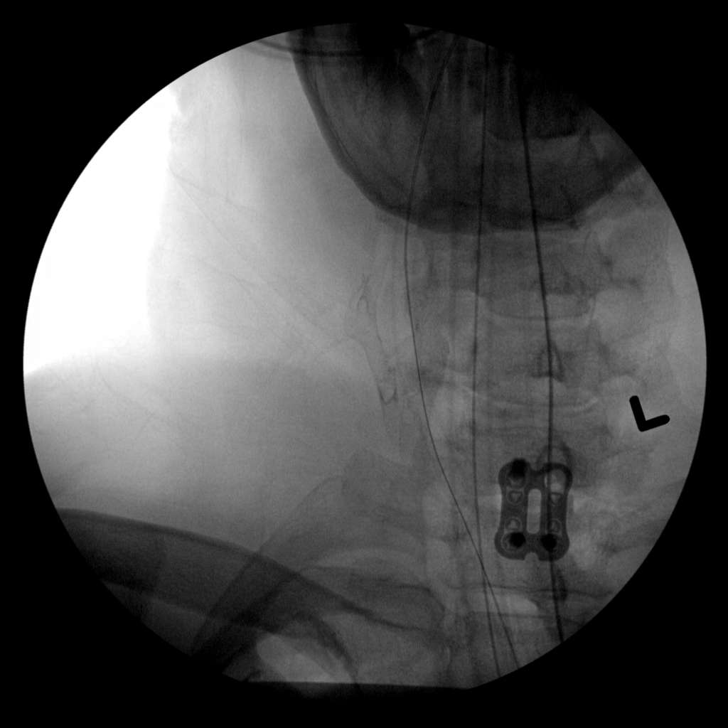
[im 5/8]
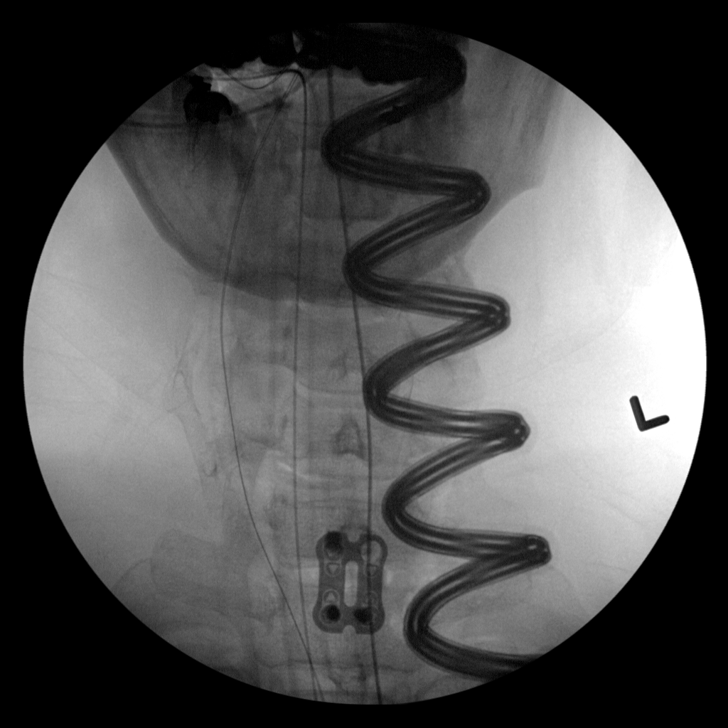
[im 6/8]
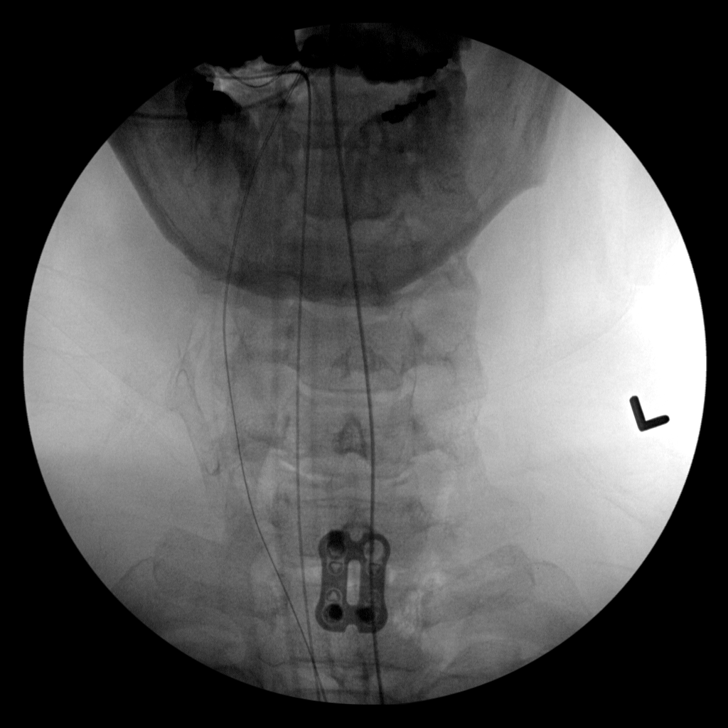
[im 7/8]
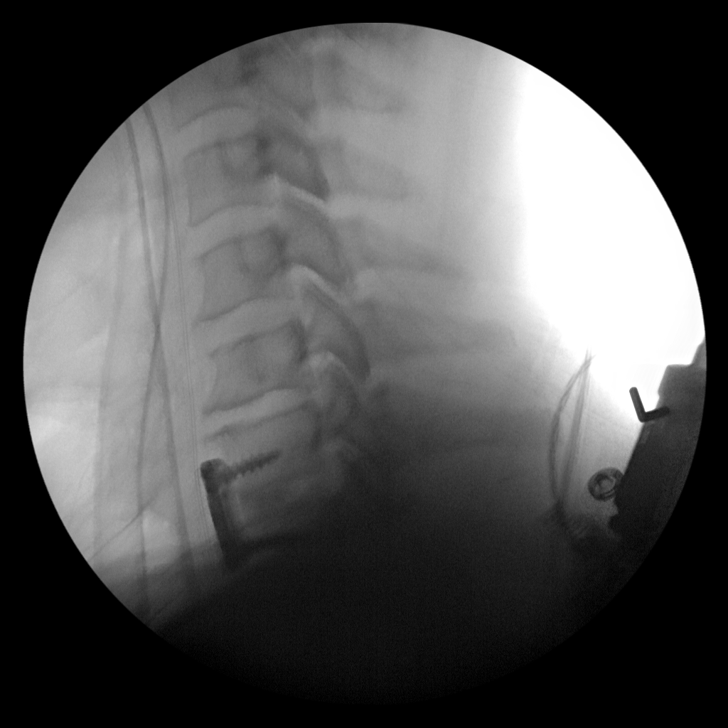
[im 8/8]
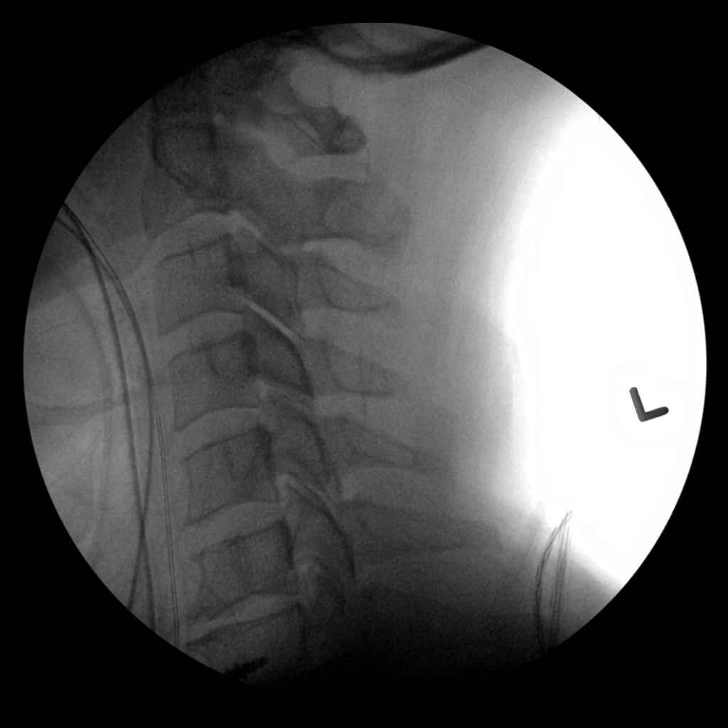

[8 of 8 positions shown; findings below may reference images not displayed]

FINDINGS: Images demonstrate a localization needle at C7-T1. C-arm images also
demonstrate an anterior plate and plug in place at C7-T1. The
available images demonstrate only 1 screw in C7 and 2 screws in T1.
IMPRESSION: Anterior fusion at performed at C7-T1.
